# Patient Record
Sex: Male | Born: 2000 | Race: Black or African American | Hispanic: No | Marital: Single | Smoking: Never smoker
Health system: Southern US, Community
[De-identification: ages and names within clinical notes are randomized; demographics above are authoritative.]

## PROBLEM LIST (undated history)

## (undated) DIAGNOSIS — K219 Gastro-esophageal reflux disease without esophagitis: Secondary | ICD-10-CM

## (undated) HISTORY — DX: Gastro-esophageal reflux disease without esophagitis: K21.9

## (undated) HISTORY — PX: WISDOM TOOTH EXTRACTION: SHX21

---
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
## 2021-08-03 ENCOUNTER — Other Ambulatory Visit: Payer: Self-pay

## 2021-08-03 ENCOUNTER — Encounter (HOSPITAL_COMMUNITY): Payer: Self-pay

## 2021-08-03 ENCOUNTER — Ambulatory Visit (HOSPITAL_COMMUNITY)
Admission: RE | Admit: 2021-08-03 | Discharge: 2021-08-03 | Disposition: A | Discharge status: Home / Self Care | Payer: Medicaid Other | Source: Ambulatory Visit | Attending: Family Medicine | Admitting: Family Medicine

## 2021-08-03 VITALS — BP 109/73 | HR 63 | Temp 98.0°F | Resp 19

## 2021-08-03 DIAGNOSIS — R1013 Epigastric pain: Secondary | ICD-10-CM

## 2021-08-03 MED ORDER — SUCRALFATE 1 G PO TABS: 1.0000 g | ORAL_TABLET | Freq: Three times a day (TID) | ORAL | 0 refills | Status: DC

## 2021-08-03 MED ORDER — PANTOPRAZOLE SODIUM 20 MG PO TBEC: 20.0000 mg | DELAYED_RELEASE_TABLET | Freq: Every day | ORAL | 0 refills | Status: DC

## 2021-08-03 NOTE — ED Provider Notes (Signed)
[facility]   [id number] 08/03/21 Arrival Time: 1200  ASSESSMENT & PLAN:  1. Dyspepsia    Benign abdominal exam. No indications for urgent abdominal/pelvic imaging at this time. Discussed.  Begin: Meds ordered this encounter  Medications   pantoprazole (PROTONIX) 20 MG tablet    Sig: Take 1 tablet (20 mg total) by mouth daily.    Dispense:  30 tablet    Refill:  0   sucralfate (CARAFATE) 1 g tablet    Sig: Take 1 tablet (1 g total) by mouth 4 (four) times daily -  with meals and at bedtime.    Dispense:  28 tablet    Refill:  0     Follow-up Information     [facility].   Specialty: Pediatrics Why: If worsening or failing to improve as anticipated. Contact information: [address] [place] [address] [phone]                 Reviewed expectations re: course of current medical issues. Questions answered. Outlined signs and symptoms indicating need for more acute intervention. Patient verbalized understanding. After Visit Summary given.   SUBJECTIVE: History from: patient. Edward Wang is a 20 y.o. male who presents with complaint of intermittent epigastric abdominal discomfort. Onset gradual,  over past week. H/O "stomach ulcer" with similar symptoms in past; has been taking Pepcid 40mg  daily with minimal relief . Discomfort described as dull; without radiation; does not wake him at night. Reports normal flatus. Symptoms are unchanged since beginning. Fever: absent. Aggravating factors: include certain foods. Alleviating factors: include belching. Associated symptoms: none. He denies fever. Appetite: normal. PO intake: normal. Ambulatory without assistance. Urinary symptoms: none. Bowel movements: have not significantly changed. Occasional THC use. No other recreational drug use. Denies alcohol use.  History reviewed. No pertinent surgical history.   OBJECTIVE:  Vitals:   08/03/21 1234  BP: 109/73  Pulse: 63   Resp: 19  Temp: 98 F (36.7 C)  SpO2: 97%    General appearance: alert, oriented, no acute distress HEENT: [place]; AT; oropharynx moist Lungs: unlabored respirations Abdomen: soft; without distention; mild  and poorly localized tenderness to palpation over epigastric area ; normal bowel sounds; without masses or organomegaly; without guarding or rebound tenderness Back: without reported CVA tenderness; FROM at waist Extremities: without LE edema; symmetrical; without gross deformities Skin: warm and dry Neurologic: normal gait Psychological: alert and cooperative; normal mood and affect   No Known Allergies                                             History reviewed. No pertinent past medical history.  Social History   Socioeconomic History   Marital status: Single    Spouse name: Not on file   Number of children: Not on file   Years of education: Not on file   Highest education level: Not on file  Occupational History   Not on file  Tobacco Use   Smoking status: Never   Smokeless tobacco: Never  Substance and Sexual Activity   Alcohol use: Not on file   Drug use: Not on file   Sexual activity: Not on file  Other Topics Concern   Not on file  Social History Narrative   Not on file   Social Determinants of Health   Financial Resource Strain: Not on file  Food Insecurity: Not on file  Transportation Needs: Not on file  Physical Activity: Not on file  Stress: Not on file  Social Connections: Not on file  Intimate Partner Violence: Not on file    Family History  Problem Relation Age of Onset   Healthy Mother    Peptic Ulcer Father      Mardella Layman, MD 08/03/21 1309

## 2021-08-03 NOTE — ED Triage Notes (Signed)
Pt presents with abdominal pain. Reports 2 years ago he was diagnosed with a stomach ulcer. States recently he has started having that pain again. Reports using pepcid with no relief.

## 2022-07-10 ENCOUNTER — Ambulatory Visit (HOSPITAL_COMMUNITY)
Admission: EM | Admit: 2022-07-10 | Discharge: 2022-07-10 | Disposition: A | Discharge status: Home / Self Care | Payer: Managed Care, Other (non HMO) | Attending: Family Medicine | Admitting: Family Medicine

## 2022-07-10 ENCOUNTER — Encounter (HOSPITAL_COMMUNITY): Payer: Self-pay

## 2022-07-10 DIAGNOSIS — S46911A Strain of unspecified muscle, fascia and tendon at shoulder and upper arm level, right arm, initial encounter: Secondary | ICD-10-CM

## 2022-07-10 DIAGNOSIS — M25511 Pain in right shoulder: Secondary | ICD-10-CM | POA: Diagnosis not present

## 2022-07-10 MED ORDER — PREDNISONE 20 MG PO TABS: 40.0000 mg | ORAL_TABLET | Freq: Every day | ORAL | 0 refills | Status: DC

## 2022-07-10 NOTE — ED Triage Notes (Signed)
Pt states woke up this am with rt shoulder pain. Denies injury. States last night at work was lifting heavy items.

## 2022-07-11 NOTE — ED Provider Notes (Signed)
[facility]   [id number] 07/10/22 Arrival Time: 1952  ASSESSMENT & PLAN:  1. Acute pain of right shoulder   2. Strain of right shoulder, initial encounter    No indication for plain imaging of shoulder. No trauma.  Encourage ROM. H/O stomach ulcers. Begin trial of: Discharge Medication List as of 07/10/2022  8:27 PM     START taking these medications   Details  predniSONE (DELTASONE) 20 MG tablet Take 2 tablets (40 mg total) by mouth daily., Starting Tue 07/10/2022, Normal       Work/school excuse note: provided with recommended light duty.  Recommend:  Follow-up Information     [facility].   Why: If worsening or failing to improve as anticipated. Contact information: [address] [place] [address] [phone]                Reviewed expectations re: course of current medical issues. Questions answered. Outlined signs and symptoms indicating need for more acute intervention. Patient verbalized understanding. After Visit Summary given.  SUBJECTIVE: History from: patient. Edward Wang is a 21 y.o. male who reports fairly persistent moderate pain of his right shoulder; mostly anterior; described as aching; without radiation. Onset: gradual. First noted: today upon waking. 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 Symptoms have progressed to a point and plateaued since beginning. Aggravating factors: certain movements. Alleviating factors: have not been identified. Associated symptoms: none reported. Extremity sensation changes or weakness: none. Self treatment: has not tried OTC therapies.  History of similar: no.  History reviewed. No pertinent surgical history.    OBJECTIVE:  Vitals:   07/10/22 2010  BP: 122/71  Pulse: 74  Resp: 16  Temp: 99.9 F (37.7 C)  TempSrc: Oral  SpO2: 99%    General appearance: alert; no  distress HEENT: [name]; AT Neck: supple with FROM Resp: unlabored respirations Extremities: RUE: warm with well perfused appearance; fairly well localized mild to moderate tenderness over right anterior shoulder; without gross deformities; swelling: none; bruising: none; shoulder ROM: normal, with discomfort CV: brisk extremity capillary refill of RUE; 2+ radial pulse of RUE. Skin: warm and dry; no visible rashes Neurologic: gait normal; normal sensation and strength of RUE Psychological: alert and cooperative; normal mood and affect   No Known Allergies  History reviewed. No pertinent past medical history. Social History   Socioeconomic History   Marital status: Single    Spouse name: Not on file   Number of children: Not on file   Years of education: Not on file   Highest education level: Not on file  Occupational History   Not on file  Tobacco Use   Smoking status: Never   Smokeless tobacco: Never  Substance and Sexual Activity   Alcohol use: Not Currently   Drug use: Not Currently   Sexual activity: Not on file  Other Topics Concern   Not on file  Social History Narrative   Not on file   Social Determinants of Health   Financial Resource Strain: Not on file  Food Insecurity: Not on file  Transportation Needs: Not on file  Physical Activity: Not on file  Stress: Not on file  Social Connections: Not on file   Family History  Problem Relation Age of Onset   Healthy Mother    Peptic Ulcer Father    History reviewed. No pertinent surgical history.     Mardella Layman, MD 07/11/22 [phone]

## 2022-07-16 ENCOUNTER — Ambulatory Visit (HOSPITAL_COMMUNITY)
Admission: EM | Admit: 2022-07-16 | Discharge: 2022-07-16 | Disposition: A | Discharge status: Home / Self Care | Payer: Managed Care, Other (non HMO) | Attending: Emergency Medicine | Admitting: Emergency Medicine

## 2022-07-16 ENCOUNTER — Ambulatory Visit (INDEPENDENT_AMBULATORY_CARE_PROVIDER_SITE_OTHER): Payer: Managed Care, Other (non HMO)

## 2022-07-16 ENCOUNTER — Encounter (HOSPITAL_COMMUNITY): Payer: Self-pay

## 2022-07-16 DIAGNOSIS — S92514A Nondisplaced fracture of proximal phalanx of right lesser toe(s), initial encounter for closed fracture: Secondary | ICD-10-CM

## 2022-07-16 MED ORDER — PREDNISONE 20 MG PO TABS: 40.0000 mg | ORAL_TABLET | Freq: Every day | ORAL | 0 refills | Status: DC

## 2022-07-16 MED ORDER — HYDROCODONE-ACETAMINOPHEN 5-325 MG PO TABS
1.0000 | ORAL_TABLET | Freq: Once | ORAL | Status: AC
  Administered 2022-07-16: 1 via ORAL

## 2022-07-16 MED ORDER — HYDROCODONE-ACETAMINOPHEN 5-325 MG PO TABS
ORAL_TABLET | ORAL | Status: AC
  Filled 2022-07-16: qty 1

## 2022-07-16 MED ORDER — OXYCODONE HCL 5 MG PO TABS: 5.0000 mg | ORAL_TABLET | Freq: Four times a day (QID) | ORAL | 0 refills | Status: AC | PRN

## 2022-07-16 MED ORDER — PANTOPRAZOLE SODIUM 20 MG PO TBEC: 20.0000 mg | DELAYED_RELEASE_TABLET | Freq: Every day | ORAL | 0 refills | Status: DC

## 2022-07-16 NOTE — ED Triage Notes (Signed)
Pt presents with complaints of hurting his right foot on a machine at work. Complaints of pain in his right foot and being unable to bare weight.

## 2022-07-16 NOTE — Discharge Instructions (Addendum)
Your x-ray today showed a fracture ( break in bone) of fifth toe.  Starting tomorrow take prednisone every morning with food for 5 days to reduce inflammation to your foot.  Use Tylenol 500 to 1000 mg every 6 hours in addition, may use oxycodone every 6 hours as needed for severe pain, use sparingly as you will only be dispensed 20 tablets   You have been placed in a cam boot and given crutches, please wear whenever standing and walking to add stability and support, do not apply weight to the foot and use crutches for balance, may remove when at rest.  Follow up with orthopedic specialist in 1 weeks.  Call practice to make appointment. Information listed below. You may go to any orthopedic provider you deem fit.  Please do not put weight on fracture.

## 2022-07-16 NOTE — ED Provider Notes (Signed)
[facility]    CSN: [id number] Arrival date & time: 07/16/22  1416      History   Chief Complaint Chief Complaint  Patient presents with   Foot Pain    HPI Edward Wang is a 21 y.o. male.   Patient presents with right foot pain and swelling beginning today approximately at 1:50 PM after his foot became caught between a jack pallet and another object while at work.  Has been unable to bear weight.  Unable to complete range of motion due to pain.  There is bruising to the right fifth toe.  Has not attempted treatment.  Denies prior injury or trauma, numbness or tingling.  History reviewed. No pertinent past medical history.  There are no problems to display for this patient.   History reviewed. No pertinent surgical history.     Home Medications    Prior to Admission medications   Medication Sig Start Date End Date Taking? Authorizing Provider  clotrimazole (LOTRIMIN) 1 % cream Apply to affected area 2 times daily 11/20/14   Piepenbrink, Victorino Dike, PA-C  mupirocin ointment (BACTROBAN) 2 % Apply 1 application topically 3 (three) times daily. 09/03/16   Lowanda Foster, NP  pantoprazole (PROTONIX) 20 MG tablet Take 1 tablet (20 mg total) by mouth daily. 08/03/21   Mardella Layman, MD  predniSONE (DELTASONE) 20 MG tablet Take 2 tablets (40 mg total) by mouth daily. 07/10/22   Mardella Layman, MD  sucralfate (CARAFATE) 1 g tablet Take 1 tablet (1 g total) by mouth 4 (four) times daily -  with meals and at bedtime. 08/03/21   Mardella Layman, MD    Family History Family History  Problem Relation Age of Onset   Healthy Mother    Peptic Ulcer Father     Social History Social History   Tobacco Use   Smoking status: Never   Smokeless tobacco: Never  Substance Use Topics   Alcohol use: Not Currently   Drug use: Not Currently     Allergies   Patient has no known allergies.   Review of Systems Review of Systems  Constitutional: Negative.   Respiratory: Negative.     Cardiovascular: Negative.   Musculoskeletal:  Positive for gait problem and myalgias. Negative for arthralgias, back pain, joint swelling, neck pain and neck stiffness.  Skin: Negative.      Physical Exam Triage Vital Signs ED Triage Vitals  Enc Vitals Group     BP 07/16/22 1524 (!) 140/94     Pulse Rate 07/16/22 1524 70     Resp 07/16/22 1524 18     Temp 07/16/22 1524 98 F (36.7 C)     Temp src --      SpO2 07/16/22 1524 98 %     Weight --      Height --      Head Circumference --      Peak Flow --      Pain Score 07/16/22 1522 10     Pain Loc --      Pain Edu? --      Excl. in [place]? --    No data found.  Updated Vital Signs BP (!) 140/94   Pulse 70   Temp 98 F (36.7 C)   Resp 18   SpO2 98%   Visual Acuity Right Eye Distance:   Left Eye Distance:   Bilateral Distance:    Right Eye Near:   Left Eye Near:    Bilateral Near:  Physical Exam Constitutional:      Appearance: Normal appearance.  HENT:     Head: Normocephalic.  Eyes:     Extraocular Movements: Extraocular movements intact.  Pulmonary:     Effort: Pulmonary effort is normal.  Feet:     Comments: Ecchymosis noted over the proximal phalanx of the right little toe with tenderness present at the base of the fifth metatarsal, moderate to severe swelling noted over the right midfoot and along the lateral aspect, unable to bear weight, unable to complete range of motion to the right fifth toe, sensation intact Neurological:     Mental Status: He is alert and oriented to person, place, and time. Mental status is at baseline.  Psychiatric:        Mood and Affect: Mood normal.        Behavior: Behavior normal.      UC Treatments / Results  Labs (all labs ordered are listed, but only abnormal results are displayed) Labs Reviewed - No data to display  EKG   Radiology No results found.  Procedures Procedures (including critical care time)  Medications Ordered in UC Medications   HYDROcodone-acetaminophen (NORCO/VICODIN) 5-325 MG per tablet 1 tablet (has no administration in time range)    Initial Impression / Assessment and Plan / UC Course  I have reviewed the triage vital signs and the nursing notes.  Pertinent labs & imaging results that were available during my care of the patient were reviewed by me and considered in my medical decision making (see chart for details).  Closed nondisplaced fracture of the proximal phalanx of lesser toe of the right foot, initial encounter  Confirmed via x-ray, discussed findings with patient, cam boot and crutches given and applied within office, neurovascularly intact prior to and after placement, advised nonweightbearing precautions and boot to be in place whenever walking and standing, may remove at rest, may use ice and heat over the affected area in 10 to 15-minute intervals, prescribed prednisone as he has stomach ulcerations and has been told to not use NSAIDs, prescribed oxycodone for severe pain, PDMP reviewed low risk, given a dose of Vicodin in office, given walking referral to orthopedics for further evaluation and management, work note given  Final Clinical Impressions(s) / UC Diagnoses   Final diagnoses:  None   Discharge Instructions   None    ED Prescriptions   None    I have reviewed the PDMP during this encounter.   Valinda Hoar, [place] 07/16/22 [phone]

## 2022-07-18 ENCOUNTER — Ambulatory Visit (HOSPITAL_BASED_OUTPATIENT_CLINIC_OR_DEPARTMENT_OTHER): Payer: Managed Care, Other (non HMO) | Admitting: Orthopaedic Surgery

## 2023-02-06 ENCOUNTER — Encounter (HOSPITAL_COMMUNITY): Payer: Self-pay | Admitting: Emergency Medicine

## 2023-02-06 ENCOUNTER — Other Ambulatory Visit: Payer: Self-pay

## 2023-02-06 ENCOUNTER — Ambulatory Visit (HOSPITAL_COMMUNITY)
Admission: EM | Admit: 2023-02-06 | Discharge: 2023-02-06 | Disposition: A | Discharge status: Home / Self Care | Payer: Managed Care, Other (non HMO) | Attending: Internal Medicine | Admitting: Internal Medicine

## 2023-02-06 DIAGNOSIS — K219 Gastro-esophageal reflux disease without esophagitis: Secondary | ICD-10-CM

## 2023-02-06 MED ORDER — FAMOTIDINE 20 MG PO TABS: 20.0000 mg | ORAL_TABLET | Freq: Every day | ORAL | 0 refills | Status: DC

## 2023-02-06 NOTE — ED Provider Notes (Signed)
[facility]    CSN: [id number] Arrival date & time: 02/06/23  1514      History   Chief Complaint Chief Complaint  Patient presents with   medication refill    HPI Edward Wang is a 22 y.o. male.   Patient presents to urgent care for evaluation of intermittent epigastric discomfort that started yesterday.  He states that he has been taking Protonix medication intermittently for 5 years and is requesting a refill of this medication.  The last time that he had this was a few months ago and states that it helped with his symptoms.  States he has been diagnosed with peptic ulcer disease in the past and believes that his stomach ulcer is flaring up again.  States his symptoms worsen when he eats spicy and fatty foods.  He usually drinks water to help with symptoms and will use Gaviscon with relief.  Also uses Tums intermittently.  Denies chest pain, shortness of breath, cough, sore throat, fever/chills, lower abdominal pain, back pain, recent nausea, vomiting, dizziness, and diarrhea.  He does not currently have a primary care provider and has never been seen by gastroenterologist for this problem in the past.  He is not a smoker and denies drug use.     History reviewed. No pertinent past medical history.  There are no problems to display for this patient.   History reviewed. No pertinent surgical history.     Home Medications    Prior to Admission medications   Medication Sig Start Date End Date Taking? Authorizing Provider  famotidine (PEPCID) 20 MG tablet Take 1 tablet (20 mg total) by mouth daily. 02/06/23  Yes Talbot Grumbling, FNP  clotrimazole (LOTRIMIN) 1 % cream Apply to affected area 2 times daily Patient not taking: Reported on 02/06/2023 11/20/14   Baron Sane, PA-C  mupirocin ointment (BACTROBAN) 2 % Apply 1 application topically 3 (three) times daily. Patient not taking: Reported on 02/06/2023 09/03/16   Kristen Cardinal, NP  pantoprazole  (PROTONIX) 20 MG tablet Take 1 tablet (20 mg total) by mouth daily. 07/16/22   White, Leitha Schuller, NP  predniSONE (DELTASONE) 20 MG tablet Take 2 tablets (40 mg total) by mouth daily. Patient not taking: Reported on 02/06/2023 07/16/22   Hans Eden, NP  sucralfate (CARAFATE) 1 g tablet Take 1 tablet (1 g total) by mouth 4 (four) times daily -  with meals and at bedtime. 08/03/21   Vanessa Kick, MD    Family History Family History  Problem Relation Age of Onset   Healthy Mother    Peptic Ulcer Father     Social History Social History   Tobacco Use   Smoking status: Never   Smokeless tobacco: Never  Vaping Use   Vaping Use: Never used  Substance Use Topics   Alcohol use: Yes   Drug use: Yes    Types: Marijuana     Allergies   Patient has no known allergies.   Review of Systems Review of Systems Per HPI  Physical Exam Triage Vital Signs ED Triage Vitals  Enc Vitals Group     BP 02/06/23 1623 102/63     Pulse Rate 02/06/23 1623 65     Resp 02/06/23 1623 20     Temp 02/06/23 1623 98.3 F (36.8 C)     Temp Source 02/06/23 1623 Oral     SpO2 02/06/23 1623 96 %     Weight --      Height --  Head Circumference --      Peak Flow --      Pain Score 02/06/23 1621 8     Pain Loc --      Pain Edu? --      Excl. in [place]? --    No data found.  Updated Vital Signs BP 102/63 (BP Location: Left Arm)   Pulse 65   Temp 98.3 F (36.8 C) (Oral)   Resp 20   SpO2 96%   Visual Acuity Right Eye Distance:   Left Eye Distance:   Bilateral Distance:    Right Eye Near:   Left Eye Near:    Bilateral Near:     Physical Exam Vitals and nursing note reviewed.  Constitutional:      Appearance: He is not ill-appearing or toxic-appearing.  HENT:     Head: Normocephalic and atraumatic.     Right Ear: Hearing, tympanic membrane, ear canal and external ear normal.     Left Ear: Hearing, tympanic membrane, ear canal and external ear normal.     Nose: Nose normal.      Mouth/Throat:     Lips: Pink.     Mouth: Mucous membranes are moist. No injury.     Tongue: No lesions. Tongue does not deviate from midline.     Palate: No mass and lesions.     Pharynx: Oropharynx is clear. Uvula midline. No pharyngeal swelling, oropharyngeal exudate, posterior oropharyngeal erythema or uvula swelling.     Tonsils: No tonsillar exudate or tonsillar abscesses.  Eyes:     General: Lids are normal. Vision grossly intact. Gaze aligned appropriately.     Extraocular Movements: Extraocular movements intact.     Conjunctiva/sclera: Conjunctivae normal.  Cardiovascular:     Rate and Rhythm: Normal rate and regular rhythm.     Heart sounds: Normal heart sounds, S1 normal and S2 normal.  Pulmonary:     Effort: Pulmonary effort is normal. No respiratory distress.     Breath sounds: Normal breath sounds and air entry.  Abdominal:     General: Abdomen is flat. Bowel sounds are normal.     Palpations: Abdomen is soft.     Tenderness: There is no abdominal tenderness.  Musculoskeletal:     Cervical back: Neck supple.  Skin:    General: Skin is warm and dry.     Capillary Refill: Capillary refill takes less than 2 seconds.     Findings: No rash.  Neurological:     General: No focal deficit present.     Mental Status: He is alert and oriented to person, place, and time. Mental status is at baseline.     Cranial Nerves: No dysarthria or facial asymmetry.  Psychiatric:        Mood and Affect: Mood normal.        Speech: Speech normal.        Behavior: Behavior normal.        Thought Content: Thought content normal.        Judgment: Judgment normal.      UC Treatments / Results  Labs (all labs ordered are listed, but only abnormal results are displayed) Labs Reviewed - No data to display  EKG   Radiology No results found.  Procedures Procedures (including critical care time)  Medications Ordered in UC Medications - No data to display  Initial Impression /  Assessment and Plan / UC Course  I have reviewed the triage vital signs and the nursing notes.  Pertinent  labs & imaging results that were available during my care of the patient were reviewed by me and considered in my medical decision making (see chart for details).   1.  Gastroesophageal reflux disease Presentation is consistent with GERD.  Symptoms are mild and intermittent. Pepcid sent to pharmacy to be taken once daily for the next couple of weeks to help with symptomatic relief. PCP assistance initiated today, advised to follow-up with PCP for ongoing evaluation and management of GERD. Discussed lifestyle changes and avoidance of known GERD triggers.  He is agreeable with plan.  Overall well-appearing with hemodynamically stable vital signs.  Discussed physical exam and available lab work findings in clinic with patient.  Counseled patient regarding appropriate use of medications and potential side effects for all medications recommended or prescribed today. Discussed red flag signs and symptoms of worsening condition,when to call the PCP office, return to urgent care, and when to seek higher level of care in the emergency department. Patient verbalizes understanding and agreement with plan. All questions answered. Patient discharged in stable condition.    Final Clinical Impressions(s) / UC Diagnoses   Final diagnoses:  Gastroesophageal reflux disease without esophagitis     Discharge Instructions      Take prescribed medicines as directed. Medicine will help reduce the amount of acid your stomach makes and therefore improve your reflux symptoms related to acid production.   Avoid spicy or acidic foods like tomatoes, chocolate, coffee, or acidic fruits like oranges as these can trigger symptoms.  I have included acid reflux education in your packet for your review. Please also allow 2 hours after meals before lying flat to help prevent symptoms.   If your symptoms do not improve in  the next 5-7 days with interventions, please return. Go to the emergency room for severe symptoms of shortness of breath, worsening or uncontrolled abdominal or chest pain, headache, light headedness, feeling faint, nausea, vomiting, bloody vomit or stools, black tarry stools, or any other new/severe symptoms. I hope you feel better!    ED Prescriptions     Medication Sig Dispense Auth. Provider   famotidine (PEPCID) 20 MG tablet Take 1 tablet (20 mg total) by mouth daily. 30 tablet Talbot Grumbling, FNP      PDMP not reviewed this encounter.   Talbot Grumbling, [place] 02/06/23 1655

## 2023-02-06 NOTE — Discharge Instructions (Addendum)
Take prescribed medicines as directed. Medicine will help reduce the amount of acid your stomach makes and therefore improve your reflux symptoms related to acid production.   Avoid spicy or acidic foods like tomatoes, chocolate, coffee, or acidic fruits like oranges as these can trigger symptoms.  I have included acid reflux education in your packet for your review. Please also allow 2 hours after meals before lying flat to help prevent symptoms.   If your symptoms do not improve in the next 5-7 days with interventions, please return. Go to the emergency room for severe symptoms of shortness of breath, worsening or uncontrolled abdominal or chest pain, headache, light headedness, feeling faint, nausea, vomiting, bloody vomit or stools, black tarry stools, or any other new/severe symptoms. I hope you feel better!

## 2023-02-06 NOTE — ED Triage Notes (Addendum)
Patient requesting a note for job.  Wants a refill of medicine this location has prescribed for stomach ulcers.  He reports he was having pain yesterday, denies nausea, vomiting, or diarrhea.    Has not had any medicine

## 2023-09-29 ENCOUNTER — Other Ambulatory Visit: Payer: Self-pay

## 2023-09-29 ENCOUNTER — Emergency Department (HOSPITAL_COMMUNITY)
Admission: EM | Admit: 2023-09-29 | Discharge: 2023-09-30 | Disposition: A | Discharge status: Against Medical Advice | Payer: Managed Care, Other (non HMO) | Attending: Emergency Medicine | Admitting: Emergency Medicine

## 2023-09-29 DIAGNOSIS — Z5321 Procedure and treatment not carried out due to patient leaving prior to being seen by health care provider: Secondary | ICD-10-CM | POA: Insufficient documentation

## 2023-09-29 DIAGNOSIS — R1033 Periumbilical pain: Secondary | ICD-10-CM | POA: Insufficient documentation

## 2023-09-29 DIAGNOSIS — R110 Nausea: Secondary | ICD-10-CM | POA: Insufficient documentation

## 2023-09-29 NOTE — ED Triage Notes (Signed)
Pt arrives to ED c/o umbilical ABD pain. Pt states that he has stomach ulcers and that he feels like they are flaring up. Pt with medications for this but states that he is out. Pt reports nausea

## 2023-09-30 ENCOUNTER — Ambulatory Visit (HOSPITAL_COMMUNITY): Admission: RE | Admit: 2023-09-30 | Payer: Managed Care, Other (non HMO) | Source: Ambulatory Visit

## 2023-09-30 ENCOUNTER — Telehealth (HOSPITAL_COMMUNITY): Payer: Self-pay | Admitting: Emergency Medicine

## 2023-09-30 LAB — COMPREHENSIVE METABOLIC PANEL
ALT: 20 U/L (ref 0–44)
AST: 25 U/L (ref 15–41)
Albumin: 3.3 g/dL — ABNORMAL LOW (ref 3.5–5.0)
Alkaline Phosphatase: 40 U/L (ref 38–126)
Anion gap: 9 (ref 5–15)
BUN: 7 mg/dL (ref 6–20)
CO2: 24 mmol/L (ref 22–32)
Calcium: 8.6 mg/dL — ABNORMAL LOW (ref 8.9–10.3)
Chloride: 107 mmol/L (ref 98–111)
Creatinine, Ser: 0.85 mg/dL (ref 0.61–1.24)
GFR, Estimated: >60 mL/min (ref >60)
Glucose, Bld: 135 mg/dL — ABNORMAL HIGH (ref 70–99)
Potassium: 3.8 mmol/L (ref 3.5–5.1)
Sodium: 140 mmol/L (ref 135–145)
Total Bilirubin: 0.7 mg/dL (ref <1.2)
Total Protein: 5.7 g/dL — ABNORMAL LOW (ref 6.5–8.1)

## 2023-09-30 LAB — URINALYSIS, ROUTINE W REFLEX MICROSCOPIC
Bilirubin Urine: NEGATIVE
Glucose, UA: NEGATIVE mg/dL
Hgb urine dipstick: NEGATIVE
Ketones, ur: NEGATIVE mg/dL
Leukocytes,Ua: NEGATIVE
Nitrite: NEGATIVE
Protein, ur: NEGATIVE mg/dL
Specific Gravity, Urine: 1.024 (ref 1.005–1.030)
pH: 6 (ref 5.0–8.0)

## 2023-09-30 LAB — CBC
HCT: 38.9 % — ABNORMAL LOW (ref 39.0–52.0)
Hemoglobin: 12 g/dL — ABNORMAL LOW (ref 13.0–17.0)
MCH: 26.1 pg (ref 26.0–34.0)
MCHC: 30.8 g/dL (ref 30.0–36.0)
MCV: 84.6 fL (ref 80.0–100.0)
Platelets: 173 10*3/uL (ref 150–400)
RBC: 4.6 MIL/uL (ref 4.22–5.81)
RDW: 13.8 % (ref 11.5–15.5)
WBC: 5.9 10*3/uL (ref 4.0–10.5)
nRBC: 0 % (ref 0.0–0.2)

## 2023-09-30 LAB — LIPASE, BLOOD: Lipase: 117 U/L — ABNORMAL HIGH (ref 11–51)

## 2023-09-30 NOTE — ED Notes (Signed)
Pt's name called for IV placement for CT and no one answered. Pt has left AMA

## 2023-10-01 ENCOUNTER — Ambulatory Visit (HOSPITAL_COMMUNITY)
Admission: RE | Admit: 2023-10-01 | Discharge: 2023-10-01 | Disposition: A | Discharge status: Home / Self Care | Payer: Self-pay | Source: Ambulatory Visit | Attending: Emergency Medicine | Admitting: Emergency Medicine

## 2023-10-01 ENCOUNTER — Encounter (HOSPITAL_COMMUNITY): Payer: Self-pay

## 2023-10-01 ENCOUNTER — Other Ambulatory Visit: Payer: Self-pay

## 2023-10-01 VITALS — BP 104/68 | HR 76 | Temp 98.3°F | Resp 18 | Ht 69.0 in | Wt 149.9 lb

## 2023-10-01 DIAGNOSIS — K219 Gastro-esophageal reflux disease without esophagitis: Secondary | ICD-10-CM

## 2023-10-01 DIAGNOSIS — R1013 Epigastric pain: Secondary | ICD-10-CM

## 2023-10-01 DIAGNOSIS — Z8711 Personal history of peptic ulcer disease: Secondary | ICD-10-CM

## 2023-10-01 MED ORDER — PANTOPRAZOLE SODIUM 40 MG PO TBEC: 40.0000 mg | DELAYED_RELEASE_TABLET | Freq: Every day | ORAL | 0 refills | Status: DC

## 2023-10-01 MED ORDER — FAMOTIDINE 20 MG PO TABS
ORAL_TABLET | ORAL | Status: AC
  Filled 2023-10-01: qty 1

## 2023-10-01 MED ORDER — FAMOTIDINE 20 MG PO TABS
20.0000 mg | ORAL_TABLET | Freq: Once | ORAL | Status: AC
  Administered 2023-10-01: 20 mg via ORAL

## 2023-10-01 NOTE — Discharge Instructions (Addendum)
Protonix (pantoprazole) once every morning for the next 14 days. It can take 1-3 days to start working. Take first thing in the morning on an empty stomach.  Remain upright for 30 minutes after taking medicine.  Please take the full 14-day course.  You can use famotidine/Pepcid at home twice daily as needed.  Increase fluids as much as tolerated.  Bland diet for the next 2 weeks.  Avoid fatty, greasy, citrusy, spicy, caffeine and alcohol as these can worsen symptoms.  Do not use any ibuprofen/Advil or other NSAIDs as these can worsen symptoms.  Please follow up with a gastroenterologist (stomach specialist). Call today to make appointment.   If at any point your symptoms worsen, including severe pain or inability to tolerate fluids, please go directly to the emergency department.

## 2023-10-01 NOTE — ED Provider Notes (Signed)
[facility]    CSN: [id number] Arrival date & time: 10/01/23  2725      History   Chief Complaint Chief Complaint  Patient presents with   Abdominal Pain    Entered by patient    HPI Edward Wang is a 22 y.o. male.  Upper/central abdominal pain for about a week Burning sensation. Worse with orange juice. Not having nausea or vomiting Normal BM, last yesterday. Urination normal No fevers  History of reflux and "stomach ulcer" over the past few years  Reports same symptoms Has used pepcid and Protonix in the past Has not seen GI  Went to the ED yesterday but LWBS CBC hgb 12. CMP overall normal Lipase 117 CT was ordered but not completed before he left  He is not having pain right now  History reviewed. No pertinent past medical history.  There are no problems to display for this patient.   History reviewed. No pertinent surgical history.     Home Medications    Prior to Admission medications   Medication Sig Start Date End Date Taking? Authorizing Provider  pantoprazole (PROTONIX) 40 MG tablet Take 1 tablet (40 mg total) by mouth daily for 14 days. 10/01/23 10/15/23 Yes Tiffanyann Deroo, Lurena Joiner, PA-C  famotidine (PEPCID) 20 MG tablet Take 1 tablet (20 mg total) by mouth daily. 02/06/23   Carlisle Beers, FNP  sucralfate (CARAFATE) 1 g tablet Take 1 tablet (1 g total) by mouth 4 (four) times daily -  with meals and at bedtime. 08/03/21   Mardella Layman, MD    Family History Family History  Problem Relation Age of Onset   Healthy Mother    Peptic Ulcer Father     Social History Social History   Tobacco Use   Smoking status: Never   Smokeless tobacco: Never  Vaping Use   Vaping status: Never Used  Substance Use Topics   Alcohol use: Yes   Drug use: Yes    Types: Marijuana     Allergies   Patient has no known allergies.   Review of Systems Review of Systems  Gastrointestinal:  Positive for abdominal pain.   Per HPI  Physical  Exam Triage Vital Signs ED Triage Vitals  Encounter Vitals Group     BP 10/01/23 0939 104/68     Systolic BP Percentile --      Diastolic BP Percentile --      Pulse Rate 10/01/23 0939 76     Resp 10/01/23 0939 18     Temp 10/01/23 0939 98.3 F (36.8 C)     Temp Source 10/01/23 0939 Oral     SpO2 10/01/23 0939 97 %     Weight 10/01/23 0940 149 lb 14.6 oz (68 kg)     Height 10/01/23 0940 5\' 9"  (1.753 m)     Head Circumference --      Peak Flow --      Pain Score 10/01/23 0940 0     Pain Loc --      Pain Education --      Exclude from Growth Chart --    No data found.  Updated Vital Signs BP 104/68 (BP Location: Right Arm)   Pulse 76   Temp 98.3 F (36.8 C) (Oral)   Resp 18   Ht 5\' 9"  (1.753 m)   Wt 149 lb 14.6 oz (68 kg)   SpO2 97%   BMI 22.14 kg/m   Visual Acuity Right Eye Distance:   Left Eye Distance:  Bilateral Distance:    Right Eye Near:   Left Eye Near:    Bilateral Near:     Physical Exam Vitals and nursing note reviewed.  Constitutional:      Appearance: Normal appearance.  HENT:     Mouth/Throat:     Mouth: Mucous membranes are moist.     Pharynx: Oropharynx is clear.  Eyes:     General: No scleral icterus.    Conjunctiva/sclera: Conjunctivae normal.  Cardiovascular:     Rate and Rhythm: Normal rate and regular rhythm.     Heart sounds: Normal heart sounds.  Pulmonary:     Effort: Pulmonary effort is normal.     Breath sounds: Normal breath sounds.  Abdominal:     General: Bowel sounds are normal.     Palpations: Abdomen is soft.     Tenderness: There is no abdominal tenderness. There is no right CVA tenderness, left CVA tenderness, guarding or rebound.  Musculoskeletal:        General: Normal range of motion.  Skin:    General: Skin is warm and dry.     Coloration: Skin is not pale.  Neurological:     Mental Status: He is alert and oriented to person, place, and time.      UC Treatments / Results  Labs (all labs ordered are  listed, but only abnormal results are displayed) Labs Reviewed - No data to display  EKG   Radiology No results found.  Procedures Procedures (including critical care time)  Medications Ordered in UC Medications  famotidine (PEPCID) tablet 20 mg (20 mg Oral Given 10/01/23 1019)    Initial Impression / Assessment and Plan / UC Course  I have reviewed the triage vital signs and the nursing notes.  Pertinent labs & imaging results that were available during my care of the patient were reviewed by me and considered in my medical decision making (see chart for details).  Afebrile and well appearing today Good exam Hgb 12 and lipase 117 at ED yesterday  No red flags at this time. I low concern for a GI bleed. No dark stools, blood in stool, hemoptysis, shortness of breath. I do not think he needs emergency evaluation at this time However discussed strict ED precautions for any acute worsening of symptoms. Otherwise will use Protonix daily, bland diet, avoid triggers. GI information given for follow up. Pepcid dose given in clinic per patient request   Final Clinical Impressions(s) / UC Diagnoses   Final diagnoses:  History of gastric ulcer  Gastroesophageal reflux disease without esophagitis  Epigastric pain     Discharge Instructions      Protonix (pantoprazole) once every morning for the next 14 days. It can take 1-3 days to start working. Take first thing in the morning on an empty stomach.  Remain upright for 30 minutes after taking medicine.  Please take the full 14-day course.  You can use famotidine/Pepcid at home twice daily as needed.  Increase fluids as much as tolerated.  Bland diet for the next 2 weeks.  Avoid fatty, greasy, citrusy, spicy, caffeine and alcohol as these can worsen symptoms.  Do not use any ibuprofen/Advil or other NSAIDs as these can worsen symptoms.  Please follow up with a gastroenterologist (stomach specialist). Call today to make  appointment.   If at any point your symptoms worsen, including severe pain or inability to tolerate fluids, please go directly to the emergency department.    ED Prescriptions  Medication Sig Dispense Auth. Provider   pantoprazole (PROTONIX) 40 MG tablet Take 1 tablet (40 mg total) by mouth daily for 14 days. 14 tablet Kashif Pooler, Lurena Joiner, PA-C      PDMP not reviewed this encounter.   Loys Hoselton, Lurena Joiner, PA-C 10/01/23 1320

## 2023-10-01 NOTE — ED Triage Notes (Signed)
Pt reports abd pain for few days, denies nausea, vomiting or diarrhea, endorses hx of stomach Ulcers.

## 2023-11-01 ENCOUNTER — Emergency Department (HOSPITAL_BASED_OUTPATIENT_CLINIC_OR_DEPARTMENT_OTHER)
Admission: EM | Admit: 2023-11-01 | Discharge: 2023-11-01 | Disposition: A | Discharge status: Home / Self Care | Payer: No Typology Code available for payment source | Attending: Emergency Medicine | Admitting: Emergency Medicine

## 2023-11-01 ENCOUNTER — Other Ambulatory Visit: Payer: Self-pay

## 2023-11-01 ENCOUNTER — Emergency Department (HOSPITAL_BASED_OUTPATIENT_CLINIC_OR_DEPARTMENT_OTHER): Payer: No Typology Code available for payment source | Admitting: Radiology

## 2023-11-01 ENCOUNTER — Encounter (HOSPITAL_BASED_OUTPATIENT_CLINIC_OR_DEPARTMENT_OTHER): Payer: Self-pay

## 2023-11-01 DIAGNOSIS — S46012A Strain of muscle(s) and tendon(s) of the rotator cuff of left shoulder, initial encounter: Secondary | ICD-10-CM | POA: Diagnosis not present

## 2023-11-01 DIAGNOSIS — M25512 Pain in left shoulder: Secondary | ICD-10-CM | POA: Diagnosis present

## 2023-11-01 DIAGNOSIS — S46912A Strain of unspecified muscle, fascia and tendon at shoulder and upper arm level, left arm, initial encounter: Secondary | ICD-10-CM

## 2023-11-01 DIAGNOSIS — S298XXA Other specified injuries of thorax, initial encounter: Secondary | ICD-10-CM

## 2023-11-01 DIAGNOSIS — S299XXA Unspecified injury of thorax, initial encounter: Secondary | ICD-10-CM | POA: Diagnosis not present

## 2023-11-01 DIAGNOSIS — Y92410 Unspecified street and highway as the place of occurrence of the external cause: Secondary | ICD-10-CM | POA: Insufficient documentation

## 2023-11-01 MED ORDER — OXYCODONE-ACETAMINOPHEN 5-325 MG PO TABS
1.0000 | ORAL_TABLET | Freq: Once | ORAL | Status: AC
  Administered 2023-11-01: 1 via ORAL
  Filled 2023-11-01: qty 1

## 2023-11-01 MED ORDER — METHOCARBAMOL 500 MG PO TABS
500.0000 mg | ORAL_TABLET | Freq: Three times a day (TID) | ORAL | 0 refills | Status: DC | PRN
Start: 2023-11-01 — End: 2023-11-02
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
## 2023-11-01 NOTE — ED Notes (Signed)
Reviewed AVS/discharge instruction with patient. Time allotted for and all questions answered. Patient is agreeable for d/c and escorted to ed exit by staff.  
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
## 2023-11-02 ENCOUNTER — Emergency Department (HOSPITAL_BASED_OUTPATIENT_CLINIC_OR_DEPARTMENT_OTHER)
Admission: EM | Admit: 2023-11-02 | Discharge: 2023-11-02 | Disposition: A | Discharge status: Home / Self Care | Payer: BC Managed Care – PPO | Attending: Emergency Medicine | Admitting: Emergency Medicine

## 2023-11-02 ENCOUNTER — Other Ambulatory Visit: Payer: Self-pay

## 2023-11-02 DIAGNOSIS — M542 Cervicalgia: Secondary | ICD-10-CM | POA: Diagnosis not present

## 2023-11-02 DIAGNOSIS — M25512 Pain in left shoulder: Secondary | ICD-10-CM | POA: Diagnosis not present

## 2023-11-02 DIAGNOSIS — M546 Pain in thoracic spine: Secondary | ICD-10-CM | POA: Insufficient documentation

## 2023-11-02 MED ORDER — CYCLOBENZAPRINE HCL 10 MG PO TABS: 10.0000 mg | ORAL_TABLET | Freq: Two times a day (BID) | ORAL | 0 refills | Status: DC | PRN
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
## 2023-11-02 NOTE — Discharge Instructions (Signed)
As discussed, we will try a different muscle laxer for treatment of your symptoms.  You can continue to use your over-the-counter medication for your baseline pain with muscle laxer as needed.  Note the muscle laxer can cause drowsiness so please do not drive or perform any high risk activity and to realize this medications effects on you.  Do not take this medication in combination with the Robaxin (methocarbamol) as they are in the same family of medication.  Please do not hesitate to return if the worrisome signs and symptoms we discussed become apparent.
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
## 2023-12-24 ENCOUNTER — Other Ambulatory Visit: Payer: Self-pay | Admitting: Pain Medicine

## 2023-12-24 DIAGNOSIS — S134XXS Sprain of ligaments of cervical spine, sequela: Secondary | ICD-10-CM

## 2024-01-26 ENCOUNTER — Encounter (HOSPITAL_COMMUNITY): Payer: Self-pay

## 2024-01-26 ENCOUNTER — Ambulatory Visit (HOSPITAL_COMMUNITY)
Admission: EM | Admit: 2024-01-26 | Discharge: 2024-01-26 | Disposition: A | Discharge status: Home / Self Care | Attending: Family Medicine | Admitting: Family Medicine

## 2024-01-26 DIAGNOSIS — N342 Other urethritis: Secondary | ICD-10-CM | POA: Insufficient documentation

## 2024-01-26 MED ORDER — CEFTRIAXONE SODIUM 500 MG IJ SOLR
INTRAMUSCULAR | Status: AC
  Filled 2024-01-26: qty 500

## 2024-01-26 MED ORDER — DOXYCYCLINE HYCLATE 100 MG PO CAPS
100.0000 mg | ORAL_CAPSULE | Freq: Two times a day (BID) | ORAL | 0 refills | Status: AC
Start: 2024-01-26 — End: 2024-02-02

## 2024-01-26 MED ORDER — CEFTRIAXONE SODIUM 500 MG IJ SOLR
500.0000 mg | INTRAMUSCULAR | Status: DC
  Administered 2024-01-26: 500 mg via INTRAMUSCULAR

## 2024-01-26 MED ORDER — LIDOCAINE HCL (PF) 1 % IJ SOLN
INTRAMUSCULAR | Status: AC
  Filled 2024-01-26: qty 2

## 2024-01-26 NOTE — Discharge Instructions (Signed)
 Staff will notify you if there is anything positive on the swab.  You have been given a shot of ceftriaxone 500 mg; this is for potential gonorrhea infection  Take doxycycline 100 mg --1 capsule 2 times daily for 7 days; this is for potential chlamydia infection.

## 2024-01-26 NOTE — ED Triage Notes (Signed)
 Pt states that he has some burning with urination and discharge from penis. X2 days

## 2024-01-26 NOTE — ED Provider Notes (Signed)
 [facility]    CSN: [id number] Arrival date & time: 01/26/24  1242      History   Chief Complaint Chief Complaint  Patient presents with   Dysuria    Burning with urination and discharge from penis    HPI Edward Wang is a 23 y.o. male.    Dysuria Presenting symptoms: dysuria   Here for dysuria and penile discharge.  He has noted the symptoms for about 2 days.  No fever or vomiting or abdominal pain.  NKDA     History reviewed. No pertinent past medical history.  There are no active problems to display for this patient.   History reviewed. No pertinent surgical history.     Home Medications    Prior to Admission medications   Medication Sig Start Date End Date Taking? Authorizing Provider  doxycycline (VIBRAMYCIN) 100 MG capsule Take 1 capsule (100 mg total) by mouth 2 (two) times daily for 7 days. 01/26/24 02/02/24 Yes Zenia Resides, MD  pantoprazole (PROTONIX) 40 MG tablet Take 1 tablet (40 mg total) by mouth daily for 14 days. 10/01/23 01/26/24 Yes Rising, Lurena Joiner, PA-C    Family History Family History  Problem Relation Age of Onset   Healthy Mother    Peptic Ulcer Father     Social History Social History   Tobacco Use   Smoking status: Never   Smokeless tobacco: Never  Vaping Use   Vaping status: Never Used  Substance Use Topics   Alcohol use: Yes    Comment: occ   Drug use: Yes    Types: Marijuana     Allergies   Patient has no known allergies.   Review of Systems Review of Systems  Genitourinary:  Positive for dysuria.     Physical Exam Triage Vital Signs ED Triage Vitals  Encounter Vitals Group     BP 01/26/24 1313 118/74     Systolic BP Percentile --      Diastolic BP Percentile --      Pulse Rate 01/26/24 1313 73     Resp 01/26/24 1313 18     Temp 01/26/24 1313 98.1 F (36.7 C)     Temp Source 01/26/24 1313 Oral     SpO2 01/26/24 1313 98 %     Weight 01/26/24 1312 150 lb (68 kg)     Height 01/26/24 1312 5'  9" (1.753 m)     Head Circumference --      Peak Flow --      Pain Score 01/26/24 1311 10     Pain Loc --      Pain Education --      Exclude from Growth Chart --    No data found.  Updated Vital Signs BP 118/74 (BP Location: Right Arm)   Pulse 73   Temp 98.1 F (36.7 C) (Oral)   Resp 18   Ht 5\' 9"  (1.753 m)   Wt 68 kg   SpO2 98%   BMI 22.15 kg/m   Visual Acuity Right Eye Distance:   Left Eye Distance:   Bilateral Distance:    Right Eye Near:   Left Eye Near:    Bilateral Near:     Physical Exam Vitals reviewed.  Constitutional:      General: He is not in acute distress.    Appearance: He is not ill-appearing, toxic-appearing or diaphoretic.  HENT:     Mouth/Throat:     Mouth: Mucous membranes are moist.  Eyes:  Extraocular Movements: Extraocular movements intact.     Conjunctiva/sclera: Conjunctivae normal.     Pupils: Pupils are equal, round, and reactive to light.  Cardiovascular:     Rate and Rhythm: Normal rate and regular rhythm.     Heart sounds: No murmur heard. Pulmonary:     Effort: Pulmonary effort is normal.     Breath sounds: Normal breath sounds.  Musculoskeletal:     Cervical back: Neck supple.  Lymphadenopathy:     Cervical: No cervical adenopathy.  Skin:    Coloration: Skin is not jaundiced or pale.  Neurological:     General: No focal deficit present.     Mental Status: He is alert and oriented to person, place, and time.  Psychiatric:        Behavior: Behavior normal.      UC Treatments / Results  Labs (all labs ordered are listed, but only abnormal results are displayed) Labs Reviewed  CYTOLOGY, (ORAL, ANAL, URETHRAL) ANCILLARY ONLY    EKG   Radiology No results found.  Procedures Procedures (including critical care time)  Medications Ordered in UC Medications  cefTRIAXone (ROCEPHIN) injection 500 mg (has no administration in time range)    Initial Impression / Assessment and Plan / UC Course  I have reviewed  the triage vital signs and the nursing notes.  Pertinent labs & imaging results that were available during my care of the patient were reviewed by me and considered in my medical decision making (see chart for details).     Urethral self swab is done and staff will notify him of any positives and treat per protocol.  We discussed options and he desires to have empiric treatment.  Rocephin is given IM and doxycycline is sent into the pharmacy.  We will treat trichomonas if positive per protocol Final Clinical Impressions(s) / UC Diagnoses   Final diagnoses:  Urethritis     Discharge Instructions      Staff will notify you if there is anything positive on the swab.  You have been given a shot of ceftriaxone 500 mg; this is for potential gonorrhea infection  Take doxycycline 100 mg --1 capsule 2 times daily for 7 days; this is for potential chlamydia infection.      ED Prescriptions     Medication Sig Dispense Auth. Provider   doxycycline (VIBRAMYCIN) 100 MG capsule Take 1 capsule (100 mg total) by mouth 2 (two) times daily for 7 days. 14 capsule Marlinda Mike, Janace Aris, MD      PDMP not reviewed this encounter.   Zenia Resides, MD 01/26/24 1340

## 2024-01-27 LAB — CYTOLOGY, (ORAL, ANAL, URETHRAL) ANCILLARY ONLY
Chlamydia: NEGATIVE
Comment: NEGATIVE
Comment: NEGATIVE
Comment: NORMAL
Neisseria Gonorrhea: NEGATIVE
Trichomonas: NEGATIVE

## 2024-06-06 ENCOUNTER — Emergency Department (HOSPITAL_COMMUNITY)
Admission: EM | Admit: 2024-06-06 | Discharge: 2024-06-06 | Disposition: A | Discharge status: Home / Self Care | Attending: Emergency Medicine | Admitting: Emergency Medicine

## 2024-06-06 ENCOUNTER — Other Ambulatory Visit: Payer: Self-pay

## 2024-06-06 ENCOUNTER — Emergency Department (HOSPITAL_COMMUNITY)

## 2024-06-06 ENCOUNTER — Encounter (HOSPITAL_COMMUNITY): Payer: Self-pay

## 2024-06-06 ENCOUNTER — Encounter (HOSPITAL_COMMUNITY): Payer: Self-pay | Admitting: Emergency Medicine

## 2024-06-06 ENCOUNTER — Ambulatory Visit (HOSPITAL_COMMUNITY)
Admission: EM | Admit: 2024-06-06 | Discharge: 2024-06-06 | Disposition: A | Discharge status: Home / Self Care | Attending: Family Medicine | Admitting: Family Medicine

## 2024-06-06 DIAGNOSIS — R1084 Generalized abdominal pain: Secondary | ICD-10-CM | POA: Insufficient documentation

## 2024-06-06 DIAGNOSIS — R935 Abnormal findings on diagnostic imaging of other abdominal regions, including retroperitoneum: Secondary | ICD-10-CM | POA: Insufficient documentation

## 2024-06-06 LAB — URINALYSIS, ROUTINE W REFLEX MICROSCOPIC
Bilirubin Urine: NEGATIVE
Glucose, UA: NEGATIVE mg/dL
Hgb urine dipstick: NEGATIVE
Ketones, ur: NEGATIVE mg/dL
Leukocytes,Ua: NEGATIVE
Nitrite: NEGATIVE
Protein, ur: NEGATIVE mg/dL
Specific Gravity, Urine: 1.023 (ref 1.005–1.030)
pH: 6 (ref 5.0–8.0)

## 2024-06-06 LAB — COMPREHENSIVE METABOLIC PANEL WITH GFR
ALT: 29 U/L (ref 0–44)
AST: 28 U/L (ref 15–41)
Albumin: 3.8 g/dL (ref 3.5–5.0)
Alkaline Phosphatase: 39 U/L (ref 38–126)
Anion gap: 8 (ref 5–15)
BUN: 10 mg/dL (ref 6–20)
CO2: 27 mmol/L (ref 22–32)
Calcium: 9 mg/dL (ref 8.9–10.3)
Chloride: 104 mmol/L (ref 98–111)
Creatinine, Ser: 0.92 mg/dL (ref 0.61–1.24)
GFR, Estimated: >60 mL/min (ref >60)
Glucose, Bld: 107 mg/dL — ABNORMAL HIGH (ref 70–99)
Potassium: 4 mmol/L (ref 3.5–5.1)
Sodium: 139 mmol/L (ref 135–145)
Total Bilirubin: 1.2 mg/dL (ref 0.0–1.2)
Total Protein: 6.2 g/dL — ABNORMAL LOW (ref 6.5–8.1)

## 2024-06-06 LAB — CBC
HCT: 43.6 % (ref 39.0–52.0)
Hemoglobin: 13.6 g/dL (ref 13.0–17.0)
MCH: 26.3 pg (ref 26.0–34.0)
MCHC: 31.2 g/dL (ref 30.0–36.0)
MCV: 84.3 fL (ref 80.0–100.0)
Platelets: 184 K/uL (ref 150–400)
RBC: 5.17 MIL/uL (ref 4.22–5.81)
RDW: 14.3 % (ref 11.5–15.5)
WBC: 3.6 K/uL — ABNORMAL LOW (ref 4.0–10.5)
nRBC: 0 % (ref 0.0–0.2)

## 2024-06-06 LAB — LACTIC ACID, PLASMA: Lactic Acid, Venous: 1.4 mmol/L (ref 0.5–1.9)

## 2024-06-06 LAB — LIPASE, BLOOD: Lipase: 30 U/L (ref 11–51)

## 2024-06-06 MED ORDER — ALUM & MAG HYDROXIDE-SIMETH 200-200-20 MG/5ML PO SUSP
30.0000 mL | Freq: Once | ORAL | Status: AC
  Administered 2024-06-06: 30 mL via ORAL
  Filled 2024-06-06: qty 30

## 2024-06-06 MED ORDER — ONDANSETRON HCL 4 MG/2ML IJ SOLN
4.0000 mg | Freq: Once | INTRAMUSCULAR | Status: AC
  Administered 2024-06-06: 4 mg via INTRAVENOUS
  Filled 2024-06-06: qty 2

## 2024-06-06 MED ORDER — FENTANYL CITRATE PF 50 MCG/ML IJ SOSY
50.0000 ug | PREFILLED_SYRINGE | Freq: Once | INTRAMUSCULAR | Status: AC
  Administered 2024-06-06: 50 ug via INTRAVENOUS
  Filled 2024-06-06: qty 1

## 2024-06-06 MED ORDER — SODIUM CHLORIDE 0.9 % IV BOLUS
1000.0000 mL | Freq: Once | INTRAVENOUS | Status: AC
  Administered 2024-06-06: 1000 mL via INTRAVENOUS

## 2024-06-06 MED ORDER — OXYCODONE-ACETAMINOPHEN 5-325 MG PO TABS: 1.0000 | ORAL_TABLET | ORAL | 0 refills | Status: DC | PRN

## 2024-06-06 MED ORDER — OXYCODONE-ACETAMINOPHEN 5-325 MG PO TABS
1.0000 | ORAL_TABLET | ORAL | Status: DC | PRN
  Administered 2024-06-06: 1 via ORAL
  Filled 2024-06-06: qty 1

## 2024-06-06 MED ORDER — ONDANSETRON HCL 4 MG/2ML IJ SOLN
4.0000 mg | Freq: Once | INTRAMUSCULAR | Status: AC
Start: 2024-06-06 — End: 2024-06-06
  Administered 2024-06-06: 4 mg via INTRAVENOUS
  Filled 2024-06-06: qty 2

## 2024-06-06 MED ORDER — OMEPRAZOLE 20 MG PO CPDR: 20.0000 mg | DELAYED_RELEASE_CAPSULE | Freq: Every day | ORAL | 0 refills | Status: DC

## 2024-06-06 MED ORDER — SUCRALFATE 1 G PO TABS: 1.0000 g | ORAL_TABLET | Freq: Three times a day (TID) | ORAL | 0 refills | Status: AC

## 2024-06-06 MED ORDER — ONDANSETRON 4 MG PO TBDP
4.0000 mg | ORAL_TABLET | Freq: Three times a day (TID) | ORAL | 0 refills | Status: AC | PRN
Start: 2024-06-06 — End: ?

## 2024-06-06 MED ORDER — IOHEXOL 350 MG/ML SOLN
75.0000 mL | Freq: Once | INTRAVENOUS | Status: AC | PRN
  Administered 2024-06-06: 75 mL via INTRAVENOUS

## 2024-06-06 MED ORDER — LIDOCAINE VISCOUS HCL 2 % MT SOLN
15.0000 mL | Freq: Once | OROMUCOSAL | Status: AC
  Administered 2024-06-06: 15 mL via ORAL
  Filled 2024-06-06: qty 15

## 2024-06-06 NOTE — ED Provider Notes (Signed)
 [facility]ENC[facility] DEPARTMENT AT [facility] Provider Note   CSN: [id number] Arrival date & time: 06/06/24  1109     Patient presents with: Abdominal Pain   Edward Wang is a 23 y.o. male.   The history is provided by the patient and medical records. No language interpreter was used.  Abdominal Pain Pain location:  Generalized Pain quality: aching and sharp   Pain radiates to:  Does not radiate Pain severity:  Severe Onset quality:  Gradual Duration:  2 days Timing:  Constant Progression:  Waxing and waning Chronicity:  Recurrent Context: eating   Context: not alcohol use, not previous surgeries and not trauma  Suspicious food intake: greasy pizza. Relieved by:  Nothing Worsened by:  Nothing Ineffective treatments:  None tried Associated symptoms: nausea   Associated symptoms: no chest pain, no chills, no constipation, no cough, no diarrhea, no dysuria, no fatigue, no fever, no shortness of breath and no vomiting   Risk factors: has not had multiple surgeries        Prior to Admission medications   Medication Sig Start Date End Date Taking? Authorizing Provider  pantoprazole  (PROTONIX ) 40 MG tablet Take 1 tablet (40 mg total) by mouth daily for 14 days. 10/01/23 01/26/24  Rising, Asberry, PA-C    Allergies: Patient has no known allergies.    Review of Systems  Constitutional:  Negative for chills, fatigue and fever.  HENT:  Negative for congestion.   Respiratory:  Negative for cough, chest tightness, shortness of breath and wheezing.   Cardiovascular:  Negative for chest pain and palpitations.  Gastrointestinal:  Positive for abdominal pain and nausea. Negative for constipation, diarrhea and vomiting.  Genitourinary:  Negative for dysuria, flank pain and frequency.  Musculoskeletal:  Negative for back pain, neck pain and neck stiffness.  Skin:  Negative for rash and wound.  Neurological:  Negative for headaches.  Psychiatric/Behavioral:  Negative for  agitation and confusion.   All other systems reviewed and are negative.   Updated Vital Signs BP (!) 143/109 (BP Location: Right Arm)   Pulse (!) 55   Temp 98 F (36.7 C)   Resp 18   Ht 5' 9 (1.753 m)   Wt 68 kg   SpO2 100%   BMI 22.15 kg/m   Physical Exam Vitals and nursing note reviewed.  Constitutional:      General: He is not in acute distress.    Appearance: He is well-developed. He is not ill-appearing, toxic-appearing or diaphoretic.  HENT:     Head: Normocephalic and atraumatic.     Mouth/Throat:     Mouth: Mucous membranes are moist.  Eyes:     Conjunctiva/sclera: Conjunctivae normal.  Cardiovascular:     Rate and Rhythm: Normal rate and regular rhythm.     Heart sounds: No murmur heard. Pulmonary:     Effort: Pulmonary effort is normal. No respiratory distress.     Breath sounds: Normal breath sounds. No wheezing, rhonchi or rales.  Chest:     Chest wall: No tenderness.  Abdominal:     General: Bowel sounds are normal. There is no distension.     Palpations: Abdomen is soft.     Tenderness: There is generalized abdominal tenderness. There is no right CVA tenderness or left CVA tenderness.  Genitourinary:    Comments: Deferred when offered to pt Musculoskeletal:        General: No swelling.     Cervical back: Neck supple.  Skin:    General:  Skin is warm and dry.     Capillary Refill: Capillary refill takes less than 2 seconds.  Neurological:     Mental Status: He is alert.  Psychiatric:        Mood and Affect: Mood normal.     (all labs ordered are listed, but only abnormal results are displayed) Labs Reviewed  COMPREHENSIVE METABOLIC PANEL WITH GFR - Abnormal; Notable for the following components:      Result Value   Glucose, Bld 107 (*)    Total Protein 6.2 (*)    All other components within normal limits  CBC - Abnormal; Notable for the following components:   WBC 3.6 (*)    All other components within normal limits  URINALYSIS, ROUTINE W  REFLEX MICROSCOPIC  LIPASE, BLOOD  LACTIC ACID, PLASMA  LACTIC ACID, PLASMA    EKG: None  Radiology: CT ABDOMEN PELVIS W CONTRAST Result Date: 06/06/2024 CLINICAL DATA:  Nonlocalized abdominal pain EXAM: CT ABDOMEN AND PELVIS WITH CONTRAST TECHNIQUE: Multidetector CT imaging of the abdomen and pelvis was performed using the standard protocol following bolus administration of intravenous contrast. RADIATION DOSE REDUCTION: This exam was performed according to the departmental dose-optimization program which includes automated exposure control, adjustment of the mA and/or kV according to patient size and/or use of iterative reconstruction technique. CONTRAST:  75mL OMNIPAQUE  IOHEXOL  350 MG/ML SOLN COMPARISON:  Chest x-ray 11/01/2023 FINDINGS: Lower chest: Lung bases are grossly clear.  No pleural effusion. Hepatobiliary: Diffuse periportal edema identified. Contrast bolus is more late arterial phase than portal venous phase. Poor opacification of the a [place] veins. Mild gallbladder wall thickening and edema. The gallbladder itself is nondilated. The portal vein is patent. No obvious space-occupying liver lesion. Pancreas: Unremarkable. No pancreatic ductal dilatation or surrounding inflammatory changes. Spleen: Normal in size without focal abnormality.  Small splenules. Adrenals/Urinary Tract: Adrenal glands are unremarkable. Kidneys are normal, without renal calculi, focal lesion, or hydronephrosis. Bladder is unremarkable. Stomach/Bowel: Stomach is underdistended. Small bowel is nondilated. Large bowel is nondilated with scattered stool. Appendix is not well seen in the right lower quadrant but no specific pericecal stranding or fluid. There is some dependent fluid in the pelvis with some wall thickening along the anorectal region. Please correlate with symptoms. Some adjacent stranding overall evaluation of the bowel somewhat limited due to lack of intra-abdominal fat and no oral contrast.  Vascular/Lymphatic: No significant vascular findings are present. No enlarged abdominal or pelvic lymph nodes. Reproductive: Prostate is unremarkable. Other: No definite free air. Musculoskeletal: No acute or significant osseous findings. IMPRESSION: Mild wall thickening along the distal colon at the anorectal region with adjacent stranding and some trace free fluid. There is also some scattered mesenteric stranding and other areas of fluid as best seen on coronal imaging. Please correlate for infectious or inflammatory process. No bowel obstruction. The appendix is poorly seen but no specific pericecal stranding or fluid. Periportal edema. There is also some wall thickening and edema along the nondilated gallbladder. Please correlate with clinical presentation. Gallbladder ultrasound could be considered as clinically appropriate to exclude acute gallbladder pathology. Study somewhat limited as images are in the late arterial phase. There is poor opacification of the hepatic veins. Critical Value/emergent results were called by telephone at the time of interpretation on 06/06/2024 at 3:39 pm to provider [facility] , who verbally acknowledged these results. Electronically Signed   By: Ranell Bring M.D.   On: 06/06/2024 15:48     Procedures   Medications Ordered in the  ED  oxyCODONE -acetaminophen  (PERCOCET/ROXICET) 5-325 MG per tablet 1 tablet (1 tablet Oral Given 06/06/24 1128)  sodium chloride  0.9 % bolus 1,000 mL (0 mLs Intravenous Stopped 06/06/24 1438)  ondansetron  (ZOFRAN ) injection 4 mg (4 mg Intravenous Given 06/06/24 1252)  fentaNYL  (SUBLIMAZE ) injection 50 mcg (50 mcg Intravenous Given 06/06/24 1252)  alum & mag hydroxide-simeth (MAALOX/MYLANTA) 200-200-20 MG/5ML suspension 30 mL (30 mLs Oral Given 06/06/24 1236)    And  lidocaine  (XYLOCAINE ) 2 % viscous mouth solution 15 mL (15 mLs Oral Given 06/06/24 1236)  iohexol  (OMNIPAQUE ) 350 MG/ML injection 75 mL (75 mLs Intravenous Contrast Given  06/06/24 1501)  fentaNYL  (SUBLIMAZE ) injection 50 mcg (50 mcg Intravenous Given 06/06/24 1703)  ondansetron  (ZOFRAN ) injection 4 mg (4 mg Intravenous Given 06/06/24 1704)                                    Medical Decision Making Amount and/or Complexity of Data Reviewed Labs: ordered. Radiology: ordered.  Risk OTC drugs. Prescription drug management.    Edward Wang is a 23 y.o. male with a past medical history significant for peptic ulcer disease who presents with severe abdominal pain.  He reports with family that for the last week he has had some unsettling in his stomach and had pizza last night that seems to have made everything worse.  He did have a bowel movement yesterday that was fairly normal without blood or constipation or diarrhea.  He said that since the evening he has had severe 10 out of 10 abdominal pain all across his abdomen.  He has had some nausea but no vomiting.  Denies any bowel changes.  Denies any urinary changes.  Denies trauma.  Denies rashes.  Denies any chest pain shortness breath or cough.  Denies any fevers or chills.  Reports the pain is 10 out of 10.  He went to an urgent care and was sent here for CT imaging given his reportedly guarding on his abdominal exam.  He does have his appendix and gallbladder and has never had any abdominal surgeries.  On arrival, patient is appearing uncomfortable having abdominal pain.  His back was nontender and chest was nontender.  Abdomen is diffusely tender but worse in the central abdomen.  Bowel sounds were appreciated.  He denied any testicle or groin symptoms and did not want that to be examined.  Legs nontender and nonedematous.  Lungs clear and chest nontender.  Patient otherwise well-appearing.  Will give some pain medicine nausea medicine and some fluids as she does have a dry mouth.  Will give a GI cocktail initially.  Will get CT on pelvis to look for intra-abdominal pathology or obstruction.  Will get screening labs  and urinalysis.  If workup reassuring anticipate this is likely gastritis flare in the setting of his known ulcer disease and eating pizza.  If workup reassuring, dissipate discharge and follow-up with GI with some outpatient medications.  4:58 PM Workup began to return.  CBC and metabolic panel overall reassuring.  Urinalysis does not show UTI.  Lactic acid normal.  Lipase not elevated.  Vital signs reassuring.  CT scan returned somewhat abnormal with evidence of inflammation and edema.  I spoke to radiology on the phone and they think this more of an acute phase change and do not think this is acute cholecystitis.  I would not push on his abdomen again and he has absolute no tenderness in  his right upper quadrant now.  With this lack of pain there, will hold on ultrasound.  Patient thinks this is his GI tract from the pizza.    We will give another dose of medicine and p.o. challenge.  He passes, having follow-up with GI.  We will give him a written for Prilosec, Carafate , pain medicine, and nausea medicine and GI follow-up instructions.  Patient and family agree with this plan and agree to hold on more since her workup today.  Anticipate reassessment and discharge after p.o. challenge and meds.  Patient feeling much better.  Will discharge for outpatient follow-up.     Final diagnoses:  Generalized abdominal pain  Abnormal CT of the abdomen    ED Discharge Orders          Ordered    omeprazole  (PRILOSEC) 20 MG capsule  Daily        06/06/24 1832    sucralfate  (CARAFATE ) 1 g tablet  3 times daily with meals & bedtime        06/06/24 1832    ondansetron  (ZOFRAN -ODT) 4 MG disintegrating tablet  Every 8 hours PRN        06/06/24 1832    oxyCODONE -acetaminophen  (PERCOCET/ROXICET) 5-325 MG tablet  Every 4 hours PRN        06/06/24 1832            Clinical Impression: 1. Generalized abdominal pain   2. Abnormal CT of the abdomen     Disposition: Discharge  Condition:  Good  I have discussed the results, Dx and Tx plan with the pt(& family if present). He/she/they expressed understanding and agree(s) with the plan. Discharge instructions discussed at great length. Strict return precautions discussed and pt &/or family have verbalized understanding of the instructions. No further questions at time of discharge.    New Prescriptions   OMEPRAZOLE  (PRILOSEC) 20 MG CAPSULE    Take 1 capsule (20 mg total) by mouth daily.   ONDANSETRON  (ZOFRAN -ODT) 4 MG DISINTEGRATING TABLET    Take 1 tablet (4 mg total) by mouth every 8 (eight) hours as needed for nausea or vomiting.   OXYCODONE -ACETAMINOPHEN  (PERCOCET/ROXICET) 5-325 MG TABLET    Take 1 tablet by mouth every 4 (four) hours as needed for severe pain (pain score 7-10).   SUCRALFATE  (CARAFATE ) 1 G TABLET    Take 1 tablet (1 g total) by mouth 4 (four) times daily -  with meals and at bedtime.    Follow Up: G[facility], [facility] [address] [place] [address] [place] [address] [phone]     [facility] [address] [place]  [address] [phone]    [facility] [address] [place] [address] [place]  [address] [phone] Schedule an appointment as soon as possible for a visit        Romaldo Saville, Lonni PARAS, MD 06/06/24 [phone]

## 2024-06-06 NOTE — ED Notes (Signed)
 Pt given urinal.

## 2024-06-06 NOTE — Discharge Instructions (Signed)
Patient's family will drive him to the emergency room

## 2024-06-06 NOTE — Discharge Instructions (Signed)
 Your history, exam, and workup today showed inflammation and irritation in your abdomen like related to gastritis or GI tract irritation.  The CT scan did not show clear evidence of an ulcer however given your history we do want you to follow-up with GI for reassessment.  Your labs did not show significant abnormalities and on reassessment your gallbladder was not tender.  We agree after shared decision-making conversation and your improvement to discharge home to follow-up with outpatient PCP and GI teams.  Please rest and stay hydrated and use the medicines.  If any symptoms change or worsen acutely, please return to the nearest emergency department.

## 2024-06-06 NOTE — ED Triage Notes (Signed)
 Pt came in d/t abd pain d/t peptic ulcer that states has been bothering him the last week & was worse yesterday & today. Denies n/v/d, rates pain 10/10 during triage.

## 2024-06-06 NOTE — ED Notes (Signed)
 Patient is being discharged from the Urgent Care and sent to the Emergency Department via POV with mother. Per Dr Vonna, patient is in need of higher level of care due to abdominal pain. Patient is aware and verbalizes understanding of plan of care.  Vitals:   06/06/24 1043  BP: 134/86  Pulse: (!) 52  Resp: 14  Temp: 97.8 F (36.6 C)  SpO2: 98%

## 2024-06-06 NOTE — ED Provider Notes (Signed)
 [facility]    CSN: [id number] Arrival date & time: 06/06/24  1024      History   Chief Complaint Chief Complaint  Patient presents with   Abdominal Pain    HPI Edward Wang is a 23 y.o. male.    Abdominal Pain Here for intense abdominal pain that began yesterday, and then got worse with 10/10 pain last night. He did eat pizza last night before pain worsened.  Took omeprazole  and didn't help. No v/d. ? If he has nausea. No f/c, no URI symptoms.  NKDA   Last BM was last night.  He states he feels like if he could have a BM it might make him feel better and he keeps saying he would like to try to throw up that that might make him feel better.  To me he actually denies nausea History reviewed. No pertinent past medical history.  There are no active problems to display for this patient.   History reviewed. No pertinent surgical history.     Home Medications    Prior to Admission medications   Medication Sig Start Date End Date Taking? Authorizing Provider  pantoprazole  (PROTONIX ) 40 MG tablet Take 1 tablet (40 mg total) by mouth daily for 14 days. 10/01/23 01/26/24  Rising, Asberry PA-C    Family History Family History  Problem Relation Age of Onset   Healthy Mother    Peptic Ulcer Father     Social History Social History   Tobacco Use   Smoking status: Never   Smokeless tobacco: Never  Vaping Use   Vaping status: Never Used  Substance Use Topics   Alcohol use: Yes    Comment: occ   Drug use: Yes    Types: Marijuana     Allergies   Patient has no known allergies.   Review of Systems Review of Systems  Gastrointestinal:  Positive for abdominal pain.     Physical Exam Triage Vital Signs ED Triage Vitals  Encounter Vitals Group     BP 06/06/24 1043 134/86     Girls Systolic BP Percentile --      Girls Diastolic BP Percentile --      Boys Systolic BP Percentile --      Boys Diastolic BP Percentile --      Pulse Rate 06/06/24  1043 (!) 52     Resp 06/06/24 1043 14     Temp 06/06/24 1043 97.8 F (36.6 C)     Temp Source 06/06/24 1043 Oral     SpO2 06/06/24 1043 98 %     Weight --      Height --      Head Circumference --      Peak Flow --      Pain Score 06/06/24 1040 10     Pain Loc --      Pain Education --      Exclude from Growth Chart --    No data found.  Updated Vital Signs BP 134/86 (BP Location: Left Arm)   Pulse (!) 52   Temp 97.8 F (36.6 C) (Oral)   Resp 14   SpO2 98%   Visual Acuity Right Eye Distance:   Left Eye Distance:   Bilateral Distance:    Right Eye Near:   Left Eye Near:    Bilateral Near:     Physical Exam Vitals reviewed.  Constitutional:      General: He is not in acute distress.    Appearance: He is  not toxic-appearing.  HENT:     Right Ear: Ear canal normal.     Mouth/Throat:     Mouth: Mucous membranes are moist.     Pharynx: No oropharyngeal exudate or posterior oropharyngeal erythema.  Cardiovascular:     Rate and Rhythm: Normal rate and regular rhythm.     Heart sounds: No murmur heard. Pulmonary:     Effort: Pulmonary effort is normal.     Breath sounds: Normal breath sounds.  Abdominal:     Comments: Bowel sounds are quiet.  There is significant guarding and generalized significant tenderness on exam.  Musculoskeletal:     Cervical back: Neck supple.  Lymphadenopathy:     Cervical: No cervical adenopathy.  Skin:    Capillary Refill: Capillary refill takes less than 2 seconds.     Coloration: Skin is not jaundiced or pale.  Neurological:     General: No focal deficit present.     Mental Status: He is alert and oriented to person, place, and time.  Psychiatric:        Behavior: Behavior normal.      UC Treatments / Results  Labs (all labs ordered are listed, but only abnormal results are displayed) Labs Reviewed - No data to display  EKG   Radiology No results found.  Procedures Procedures (including critical care  time)  Medications Ordered in UC Medications - No data to display  Initial Impression / Assessment and Plan / UC Course  I have reviewed the triage vital signs and the nursing notes.  Pertinent labs & imaging results that were available during my care of the patient were reviewed by me and considered in my medical decision making (see chart for details).     With his intense abdominal pain and extreme tenderness that is generalized and guarding, I think he needs to be evaluated in the emergency room.  He and his mom are agreeable and they will go by private car. Final Clinical Impressions(s) / UC Diagnoses   Final diagnoses:  Generalized abdominal pain     Discharge Instructions      Patient's family will drive him to the emergency room.     ED Prescriptions   None    PDMP not reviewed this encounter.   Vonna Sharlet POUR, MD 06/06/24 1102

## 2024-06-06 NOTE — ED Notes (Signed)
 Second lactic d/c by MD.

## 2024-06-06 NOTE — ED Triage Notes (Signed)
 Pt c/o generalized abd pain that started yesterday. Denies n/v/urinary/bowel problems. Tried taking omeprazole  bc has hx ulcers. Reports can't take ibuprofen or tylenol  bc makes pain worse.

## 2024-06-08 ENCOUNTER — Encounter: Payer: Self-pay | Admitting: Gastroenterology

## 2024-07-30 ENCOUNTER — Ambulatory Visit: Admitting: Gastroenterology

## 2024-07-30 ENCOUNTER — Encounter: Payer: Self-pay | Admitting: Gastroenterology

## 2024-07-30 VITALS — BP 104/70 | HR 58 | Ht 68.0 in | Wt 160.0 lb

## 2024-07-30 DIAGNOSIS — R932 Abnormal findings on diagnostic imaging of liver and biliary tract: Secondary | ICD-10-CM

## 2024-07-30 DIAGNOSIS — R1013 Epigastric pain: Secondary | ICD-10-CM

## 2024-07-30 DIAGNOSIS — R933 Abnormal findings on diagnostic imaging of other parts of digestive tract: Secondary | ICD-10-CM

## 2024-07-30 MED ORDER — NA SULFATE-K SULFATE-MG SULF 17.5-3.13-1.6 GM/177ML PO SOLN: 1.0000 | Freq: Once | ORAL | 0 refills | Status: AC

## 2024-07-30 NOTE — Patient Instructions (Signed)
 You have been scheduled for an abdominal ultrasound at [facility] Radiology (1st floor of hospital) on Friday 08/07/24 at 8:30 am. Please arrive 30 minutes prior to your appointment for registration. Make certain not to have anything to eat or drink 6 hours prior to your appointment. Should you need to reschedule your appointment, please contact radiology at [phone]. This test typically takes about 30 minutes to perform.  You have been scheduled for an endoscopy and colonoscopy. Please follow the written instructions given to you at your visit today.  If you use inhalers (even only as needed), please bring them with you on the day of your procedure.  DO NOT TAKE 7 DAYS PRIOR TO TEST- Trulicity (dulaglutide) Ozempic, Wegovy (semaglutide) Mounjaro (tirzepatide) Bydureon Bcise (exanatide extended release)  DO NOT TAKE 1 DAY PRIOR TO YOUR TEST Rybelsus (semaglutide) Adlyxin (lixisenatide) Victoza (liraglutide) Byetta (exanatide) ___________________________________________________________________________

## 2024-07-30 NOTE — Progress Notes (Unsigned)
     07/30/2024 Edward Wang [id number] 2001-05-22   HISTORY OF PRESENT ILLNESS:  History reviewed. No pertinent past medical history. History reviewed. No pertinent surgical history.  reports that he has never smoked. He has never used smokeless tobacco. He reports current alcohol use. He reports current drug use. Drug: Marijuana. family history includes Healthy in his mother; Peptic Ulcer in his father. No Known Allergies    Outpatient Encounter Medications as of 07/30/2024  Medication Sig   omeprazole  (PRILOSEC) 20 MG capsule Take 1 capsule (20 mg total) by mouth daily.   ondansetron  (ZOFRAN -ODT) 4 MG disintegrating tablet Take 1 tablet (4 mg total) by mouth every 8 (eight) hours as needed for nausea or vomiting.   oxyCODONE -acetaminophen  (PERCOCET/ROXICET) 5-325 MG tablet Take 1 tablet by mouth every 4 (four) hours as needed for severe pain (pain score 7-10).   pantoprazole  (PROTONIX ) 40 MG tablet Take 1 tablet (40 mg total) by mouth daily for 14 days.   sucralfate  (CARAFATE ) 1 g tablet Take 1 tablet (1 g total) by mouth 4 (four) times daily -  with meals and at bedtime.   No facility-administered encounter medications on file as of 07/30/2024.     REVIEW OF SYSTEMS  : All other systems reviewed and negative except where noted in the History of Present Illness.   PHYSICAL EXAM: BP 104/70   Pulse (!) 58   Ht 5' 8 (1.727 m)   Wt 160 lb (72.6 kg)   BMI 24.33 kg/m  General: Well developed AA male in no acute distress Head: Normocephalic and atraumatic Eyes:  Sclerae anicteric, conjunctiva pink. Ears: Normal auditory acuity Lungs: Clear throughout to auscultation; no W/R/R. Heart: Regular rate and rhythm; no M/R/G. Abdomen: Soft, non-distended.  BS present.  Some diffuse TTP. Rectal:  Will be done at the time of colonoscopy. Musculoskeletal: Symmetrical with no gross deformities  Skin: No lesions on visible extremities Extremities: No edema  Neurological: Alert oriented  x 4, grossly non-focal Psychological:  Alert and cooperative. Normal mood and affect  ASSESSMENT AND PLAN: *Abnormal CT scan   -EGD and colonoscopy scheduled with Dr. Nandigam.  **The risks, benefits, and alternatives to EGD and colonoscopy were discussed with the patient and he consents to proceed.       CC:  No ref. provider found

## 2024-08-03 ENCOUNTER — Encounter: Payer: Self-pay | Admitting: Gastroenterology

## 2024-08-03 DIAGNOSIS — R1013 Epigastric pain: Secondary | ICD-10-CM | POA: Insufficient documentation

## 2024-08-03 DIAGNOSIS — R932 Abnormal findings on diagnostic imaging of liver and biliary tract: Secondary | ICD-10-CM | POA: Insufficient documentation

## 2024-08-03 DIAGNOSIS — R933 Abnormal findings on diagnostic imaging of other parts of digestive tract: Secondary | ICD-10-CM | POA: Insufficient documentation

## 2024-08-04 ENCOUNTER — Encounter: Payer: Self-pay | Admitting: Gastroenterology

## 2024-08-07 ENCOUNTER — Ambulatory Visit (HOSPITAL_COMMUNITY): Admission: RE | Admit: 2024-08-07 | Source: Ambulatory Visit

## 2024-08-13 ENCOUNTER — Encounter: Payer: Self-pay | Admitting: Gastroenterology

## 2024-08-20 ENCOUNTER — Encounter: Payer: Self-pay | Admitting: Gastroenterology

## 2024-08-20 ENCOUNTER — Ambulatory Visit (AMBULATORY_SURGERY_CENTER): Admitting: Gastroenterology

## 2024-08-20 VITALS — BP 116/96 | HR 51 | Temp 97.3°F | Resp 20 | Ht 68.0 in | Wt 160.0 lb

## 2024-08-20 DIAGNOSIS — B9681 Helicobacter pylori [H. pylori] as the cause of diseases classified elsewhere: Secondary | ICD-10-CM

## 2024-08-20 DIAGNOSIS — K648 Other hemorrhoids: Secondary | ICD-10-CM

## 2024-08-20 DIAGNOSIS — R1013 Epigastric pain: Secondary | ICD-10-CM

## 2024-08-20 DIAGNOSIS — K2951 Unspecified chronic gastritis with bleeding: Secondary | ICD-10-CM | POA: Diagnosis not present

## 2024-08-20 DIAGNOSIS — K644 Residual hemorrhoidal skin tags: Secondary | ICD-10-CM | POA: Diagnosis not present

## 2024-08-20 DIAGNOSIS — R933 Abnormal findings on diagnostic imaging of other parts of digestive tract: Secondary | ICD-10-CM

## 2024-08-20 DIAGNOSIS — K2970 Gastritis, unspecified, without bleeding: Secondary | ICD-10-CM

## 2024-08-20 MED ORDER — SODIUM CHLORIDE 0.9 % IV SOLN: 500.0000 mL | Freq: Once | INTRAVENOUS | Status: DC

## 2024-08-20 NOTE — Patient Instructions (Addendum)
 Handouts given: Gastritis, Hemorrhoids Resume previous diet. Continue present medications.  No ibuprofen, naproxen, or other non-steroid anti-inflammatory drugs. Await pathology results. Repeat colonoscopy at age 23 for screening purposes.  YOU HAD AN ENDOSCOPIC PROCEDURE TODAY AT THE [facility]:   Refer to the procedure report that was given to you for any specific questions about what was found during the examination.  If the procedure report does not answer your questions, please call your gastroenterologist to clarify.  If you requested that your care partner not be given the details of your procedure findings, then the procedure report has been included in a sealed envelope for you to review at your convenience later.  YOU SHOULD EXPECT: Some feelings of bloating in the abdomen. Passage of more gas than usual.  Walking can help get rid of the air that was put into your GI tract during the procedure and reduce the bloating. If you had a lower endoscopy (such as a colonoscopy or flexible sigmoidoscopy) you may notice spotting of blood in your stool or on the toilet paper. If you underwent a bowel prep for your procedure, you may not have a normal bowel movement for a few days.  Please Note:  You might notice some irritation and congestion in your nose or some drainage.  This is from the oxygen used during your procedure.  There is no need for concern and it should clear up in a day or so.  SYMPTOMS TO REPORT IMMEDIATELY:  Following lower endoscopy (colonoscopy or flexible sigmoidoscopy):  Excessive amounts of blood in the stool  Significant tenderness or worsening of abdominal pains  Swelling of the abdomen that is new, acute  Fever of 100F or higher  Following upper endoscopy (EGD)  Vomiting of blood or coffee ground material  New chest pain or pain under the shoulder blades  Painful or persistently difficult swallowing  New shortness of breath  Fever of 100F or  higher  Black, tarry-looking stools  For urgent or emergent issues, a gastroenterologist can be reached at any hour by calling [phone]. Do not use MyChart messaging for urgent concerns.    DIET:  We do recommend a small meal at first, but then you may proceed to your regular diet.  Drink plenty of fluids but you should avoid alcoholic beverages for 24 hours.  ACTIVITY:  You should plan to take it easy for the rest of today and you should NOT DRIVE or use heavy machinery until tomorrow (because of the sedation medicines used during the test).    FOLLOW UP: Our staff will call the number listed on your records the next business day following your procedure.  We will call around 7:15- 8:00 am to check on you and address any questions or concerns that you may have regarding the information given to you following your procedure. If we do not reach you, we will leave a message.     If any biopsies were taken you will be contacted by phone or by letter within the next 1-3 weeks.  Please call us  at [phone] if you have not heard about the biopsies in 3 weeks.    SIGNATURES/CONFIDENTIALITY: You and/or your care partner have signed paperwork which will be entered into your electronic medical record.  These signatures attest to the fact that that the information above on your After Visit Summary has been reviewed and is understood.  Full responsibility of the confidentiality of this discharge information lies with you and/or your care-partner.

## 2024-08-20 NOTE — Progress Notes (Unsigned)
 Report given to PACU, vss

## 2024-08-20 NOTE — Op Note (Signed)
 [facility] Patient Name: Edward Wang Procedure Date: 08/20/2024 3:47 PM MRN: [id number] Endoscopist: Gustav ALONSO Mcgee , MD, [id number] Age: 23 Referring MD:  Date of Birth: 2001-02-26 Gender: Male Account #: [id number] Procedure:                Upper GI endoscopy Indications:              Epigastric abdominal pain, Abdominal pain in the                            right upper quadrant Medicines:                Monitored Anesthesia Care Procedure:                Pre-Anesthesia Assessment:                           - Prior to the procedure, a History and Physical                            was performed, and patient medications and                            allergies were reviewed. The patient's tolerance of                            previous anesthesia was also reviewed. The risks                            and benefits of the procedure and the sedation                            options and risks were discussed with the patient.                            All questions were answered, and informed consent                            was obtained. Prior Anticoagulants: The patient has                            taken no anticoagulant or antiplatelet agents. ASA                            Grade Assessment: II - A patient with mild systemic                            disease. After reviewing the risks and benefits,                            the patient was deemed in satisfactory condition to                            undergo the procedure.  After obtaining informed consent, the endoscope was                            passed under direct vision. Throughout the                            procedure, the patient's blood pressure, pulse, and                            oxygen saturations were monitored continuously. The                            Olympus scope [id number] was introduced through the                            mouth, and advanced to the  second part of duodenum.                            The upper GI endoscopy was accomplished without                            difficulty. The patient tolerated the procedure                            well. Scope In: Scope Out: Findings:                 The Z-line was regular and was found 38 cm from the                            incisors.                           No gross lesions were noted in the entire esophagus.                           Patchy mild inflammation characterized by                            congestion (edema), erosions and erythema was found                            in the entire examined stomach. Biopsies were taken                            with a cold forceps for Helicobacter pylori testing.                           The cardia and gastric fundus were normal on                            retroflexion.                           The examined duodenum was normal. Complications:  No immediate complications. Estimated Blood Loss:     Estimated blood loss was minimal. Impression:               - Z-line regular, 38 cm from the incisors.                           - No gross lesions in the entire esophagus.                           - Gastritis. Biopsied.                           - Normal examined duodenum. Recommendation:           - Patient has a contact number available for                            emergencies. The signs and symptoms of potential                            delayed complications were discussed with the                            patient. Return to normal activities tomorrow.                            Written discharge instructions were provided to the                            patient.                           - Resume previous diet.                           - Continue present medications.                           - No ibuprofen, naproxen, or other non-steroidal                            anti-inflammatory drugs.                            - Await pathology results.                           - See the other procedure note for documentation of                            additional recommendations. Bryse Blanchette V. Cheyla Duchemin, MD 08/20/2024 4:27:21 PM This report has been signed electronically.

## 2024-08-20 NOTE — Progress Notes (Unsigned)
 Called to room to assist during endoscopic procedure.  Patient ID and intended procedure confirmed with present staff. Received instructions for my participation in the procedure from the performing physician.

## 2024-08-20 NOTE — Progress Notes (Unsigned)
 [facility] History and Physical   Primary Care Physician:  Patient, No Pcp Per   Reason for Procedure:  Abnormal CT of colon, abd pain  Plan:    EGD and colonoscopy with possible interventions as needed     HPI: Edward Wang is a very pleasant 23 y.o. male here for EGD and colonoscopy for abd pain (epigastric and RUQ pain), abnormal CT of colon. Please refer to office visit note by Jessica Zehr for additional details  The risks and benefits as well as alternatives of endoscopic procedure(s) have been discussed and reviewed. All questions answered. The patient agrees to proceed.    Past Medical History:  Diagnosis Date   GERD (gastroesophageal reflux disease)     Past Surgical History:  Procedure Laterality Date   WISDOM TOOTH EXTRACTION      Prior to Admission medications   Medication Sig Start Date End Date Taking? Authorizing Provider  omeprazole  (PRILOSEC) 20 MG capsule Take 1 capsule (20 mg total) by mouth daily. 06/06/24   Tegeler, Lonni PARAS, MD  ondansetron  (ZOFRAN -ODT) 4 MG disintegrating tablet Take 1 tablet (4 mg total) by mouth every 8 (eight) hours as needed for nausea or vomiting. 06/06/24   Tegeler, Lonni PARAS, MD  pantoprazole  (PROTONIX ) 40 MG tablet Take 1 tablet (40 mg total) by mouth daily for 14 days. 10/01/23 07/30/24  Rising, Asberry, PA-C  sucralfate  (CARAFATE ) 1 g tablet Take 1 tablet (1 g total) by mouth 4 (four) times daily -  with meals and at bedtime. 06/06/24   Tegeler, Lonni PARAS, MD    Current Outpatient Medications  Medication Sig Dispense Refill   omeprazole  (PRILOSEC) 20 MG capsule Take 1 capsule (20 mg total) by mouth daily. 30 capsule 0   ondansetron  (ZOFRAN -ODT) 4 MG disintegrating tablet Take 1 tablet (4 mg total) by mouth every 8 (eight) hours as needed for nausea or vomiting. 20 tablet 0   pantoprazole  (PROTONIX ) 40 MG tablet Take 1 tablet (40 mg total) by mouth daily for 14 days. 14 tablet 0   sucralfate  (CARAFATE ) 1  g tablet Take 1 tablet (1 g total) by mouth 4 (four) times daily -  with meals and at bedtime. 120 tablet 0   Current Facility-Administered Medications  Medication Dose Route Frequency Provider Last Rate Last Admin   0.9 %  sodium chloride  infusion  500 mL Intravenous Once Jannifer Fischler V, MD        Allergies as of 08/20/2024   (No Known Allergies)    Family History  Problem Relation Age of Onset   Healthy Mother    Peptic Ulcer Father    Colon cancer Neg Hx    Stomach cancer Neg Hx    Esophageal cancer Neg Hx     Social History   Socioeconomic History   Marital status: Single    Spouse name: Not on file   Number of children: Not on file   Years of education: Not on file   Highest education level: Not on file  Occupational History   Not on file  Tobacco Use   Smoking status: Never   Smokeless tobacco: Never  Vaping Use   Vaping status: Never Used  Substance and Sexual Activity   Alcohol use: Yes    Comment: occ   Drug use: Yes    Types: Marijuana    Comment: last smoked 08/18/24   Sexual activity: Not on file  Other Topics Concern   Not on file  Social History Narrative  Not on file   Social Drivers of Health   Financial Resource Strain: Not on file  Food Insecurity: Not on file  Transportation Needs: Not on file  Physical Activity: Not on file  Stress: Not on file  Social Connections: Unknown (07/17/2022)   Received from [facility]   Social Network    Social Network: Not on file  Intimate Partner Violence: Unknown (07/17/2022)   Received from [facility]   HITS    Physically Hurt: Not on file    Insult or Talk Down To: Not on file    Threaten Physical Harm: Not on file    Scream or Curse: Not on file    Review of Systems:  All other review of systems negative except as mentioned in the HPI.  Physical Exam: Vital signs in last 24 hours: BP 112/65   Pulse (!) 50   Temp (!) 97.3 F (36.3 C)   Ht 5' 8 (1.727 m)   Wt 160 lb (72.6 kg)    SpO2 100%   BMI 24.33 kg/m  General:   Alert, NAD Lungs:  Clear .   Heart:  Regular rate and rhythm Abdomen:  Soft, nontender and nondistended. Neuro/Psych:  Alert and cooperative. Normal mood and affect. A and O x 3  Reviewed labs, radiology imaging, old records and pertinent past GI work up  Patient is appropriate for planned procedure(s) and anesthesia in an ambulatory setting   K. Veena Rether Rison , MD [phone]

## 2024-08-20 NOTE — Op Note (Signed)
  [facility] Patient Name: Edward Wang Procedure Date: 08/20/2024 3:36 PM MRN: [id number] Endoscopist: Gustav ALONSO Mcgee , MD, [id number] Age: 23 Referring MD:  Date of Birth: 02-19-2001 Gender: Male Account #: [id number] Procedure:                Colonoscopy Indications:              Abnormal CT of the GI tract Medicines:                Monitored Anesthesia Care Procedure:                Pre-Anesthesia Assessment:                           - Prior to the procedure, a History and Physical                            was performed, and patient medications and                            allergies were reviewed. The patient's tolerance of                            previous anesthesia was also reviewed. The risks                            and benefits of the procedure and the sedation                            options and risks were discussed with the patient.                            All questions were answered, and informed consent                            was obtained. Prior Anticoagulants: The patient has                            taken no anticoagulant or antiplatelet agents. ASA                            Grade Assessment: II - A patient with mild systemic                            disease. After reviewing the risks and benefits,                            the patient was deemed in satisfactory condition to                            undergo the procedure.                           After obtaining informed consent, the colonoscope  was passed under direct vision. Throughout the                            procedure, the patient's blood pressure, pulse, and                            oxygen saturations were monitored continuously. The                            [id number]-HQ190L Colonoscope [id number] was introduced                            through the anus and advanced to the the terminal                            ileum, with identification of  the appendiceal                            orifice and IC valve. The colonoscopy was performed                            without difficulty. The patient tolerated the                            procedure well. The quality of the bowel                            preparation was good. The terminal ileum, ileocecal                            valve, appendiceal orifice, and rectum were                            photographed. Scope In: 4:05:00 PM Scope Out: 4:15:49 PM Scope Withdrawal Time: 0 hours 7 minutes 22 seconds  Total Procedure Duration: 0 hours 10 minutes 49 seconds  Findings:                 The perianal and digital rectal examinations were                            normal.                           The colon (entire examined portion) appeared normal.                           Non-bleeding external and internal hemorrhoids were                            found during retroflexion. The hemorrhoids were                            small.  The terminal ileum appeared normal. Complications:            No immediate complications. Estimated Blood Loss:     Estimated blood loss: none. Impression:               - The entire examined colon is normal.                           - Non-bleeding external and internal hemorrhoids.                           - The examined portion of the ileum was normal.                           - No specimens collected. Recommendation:           - Resume previous diet.                           - Continue present medications.                           - Repeat colonoscopy at age 38 for screening                            purposes. Sakina Briones V. Redith Drach, MD 08/20/2024 4:24:50 PM This report has been signed electronically.

## 2024-08-21 ENCOUNTER — Telehealth: Payer: Self-pay | Admitting: *Deleted

## 2024-08-21 NOTE — Telephone Encounter (Signed)
  Follow up Call-     08/20/2024    2:31 PM  Call back number  Post procedure Call Back phone  # [phone]  Permission to leave phone message Yes     Patient questions:  Mailbox is full.

## 2024-08-25 LAB — SURGICAL PATHOLOGY

## 2024-08-26 ENCOUNTER — Ambulatory Visit: Payer: Self-pay | Admitting: Gastroenterology

## 2024-09-01 ENCOUNTER — Other Ambulatory Visit: Payer: Self-pay

## 2024-09-11 ENCOUNTER — Telehealth: Payer: Self-pay | Admitting: Gastroenterology

## 2024-09-11 NOTE — Telephone Encounter (Signed)
 Patient returning call Requesting a call back  Please advise  Thank you

## 2024-09-16 ENCOUNTER — Other Ambulatory Visit: Payer: Self-pay

## 2024-09-16 MED ORDER — METRONIDAZOLE 250 MG PO TABS: 250.0000 mg | ORAL_TABLET | Freq: Four times a day (QID) | ORAL | 0 refills | Status: AC

## 2024-09-16 MED ORDER — TETRACYCLINE HCL 500 MG PO CAPS: 500.0000 mg | ORAL_CAPSULE | Freq: Four times a day (QID) | ORAL | 0 refills | Status: AC

## 2024-09-16 MED ORDER — BISMUTH SUBSALICYLATE 262 MG PO TABS: 2.0000 | ORAL_TABLET | Freq: Four times a day (QID) | ORAL | 0 refills | Status: AC

## 2024-09-16 MED ORDER — PANTOPRAZOLE SODIUM 20 MG PO TBEC: 20.0000 mg | DELAYED_RELEASE_TABLET | Freq: Two times a day (BID) | ORAL | 0 refills | Status: AC

## 2024-09-16 MED ORDER — OMEPRAZOLE 20 MG PO CPDR: 20.0000 mg | DELAYED_RELEASE_CAPSULE | Freq: Two times a day (BID) | ORAL | 0 refills | Status: DC

## 2024-09-18 NOTE — Telephone Encounter (Signed)
Patient returning call, please advise.  Thank you.

## 2024-09-18 NOTE — Telephone Encounter (Signed)
Mailbox is full and cannot accept messages. 

## 2024-09-18 NOTE — Telephone Encounter (Signed)
 Spoke with the patient. Reviewed briefly the results and plan of treatment. Letter with instructions will arrive in approximately a week of being mailed. He will pick up his medications from the pharmacy now. He will wait for the letter to arrive to review his treatment instructions. Call with any questions or concerns.
